# Patient Record
Sex: Female | Born: 1960 | Race: Black or African American | Hispanic: No | Marital: Single | Smoking: Current every day smoker
Health system: Southern US, Community
[De-identification: ages and names within clinical notes are randomized; demographics above are authoritative.]

## PROBLEM LIST (undated history)

## (undated) DIAGNOSIS — I10 Essential (primary) hypertension: Secondary | ICD-10-CM

## (undated) DIAGNOSIS — E785 Hyperlipidemia, unspecified: Secondary | ICD-10-CM

## (undated) DIAGNOSIS — L723 Sebaceous cyst: Secondary | ICD-10-CM

## (undated) HISTORY — PX: ABDOMINAL HYSTERECTOMY: SHX81

## (undated) HISTORY — DX: Sebaceous cyst: L72.3

## (undated) HISTORY — PX: APPENDECTOMY: SHX54

## (undated) HISTORY — DX: Essential (primary) hypertension: I10

## (undated) HISTORY — PX: OTHER SURGICAL HISTORY: SHX169

## (undated) HISTORY — DX: Hyperlipidemia, unspecified: E78.5

---

## 1999-07-12 ENCOUNTER — Other Ambulatory Visit: Admission: RE | Admit: 1999-07-12 | Discharge: 1999-07-12 | Payer: Self-pay | Admitting: Obstetrics and Gynecology

## 2000-12-25 ENCOUNTER — Other Ambulatory Visit: Admission: RE | Admit: 2000-12-25 | Discharge: 2000-12-25 | Payer: Self-pay | Admitting: *Deleted

## 2001-03-12 ENCOUNTER — Encounter (INDEPENDENT_AMBULATORY_CARE_PROVIDER_SITE_OTHER): Payer: Self-pay

## 2001-03-12 ENCOUNTER — Other Ambulatory Visit: Admission: RE | Admit: 2001-03-12 | Discharge: 2001-03-12 | Payer: Self-pay | Admitting: *Deleted

## 2001-06-18 ENCOUNTER — Other Ambulatory Visit: Admission: RE | Admit: 2001-06-18 | Discharge: 2001-06-18 | Payer: Self-pay | Admitting: Urology

## 2002-02-17 ENCOUNTER — Other Ambulatory Visit: Admission: RE | Admit: 2002-02-17 | Discharge: 2002-02-17 | Payer: Self-pay | Admitting: *Deleted

## 2003-09-26 ENCOUNTER — Other Ambulatory Visit: Admission: RE | Admit: 2003-09-26 | Discharge: 2003-09-26 | Payer: Self-pay | Admitting: Obstetrics and Gynecology

## 2003-10-18 ENCOUNTER — Ambulatory Visit (HOSPITAL_BASED_OUTPATIENT_CLINIC_OR_DEPARTMENT_OTHER): Admission: RE | Admit: 2003-10-18 | Discharge: 2003-10-18 | Payer: Self-pay | Admitting: Obstetrics and Gynecology

## 2003-10-18 ENCOUNTER — Encounter (INDEPENDENT_AMBULATORY_CARE_PROVIDER_SITE_OTHER): Payer: Self-pay | Admitting: Specialist

## 2003-10-18 ENCOUNTER — Ambulatory Visit (HOSPITAL_COMMUNITY): Admission: RE | Admit: 2003-10-18 | Discharge: 2003-10-18 | Payer: Self-pay | Admitting: Obstetrics and Gynecology

## 2004-03-02 ENCOUNTER — Encounter: Admission: RE | Admit: 2004-03-02 | Discharge: 2004-03-02 | Payer: Self-pay | Admitting: Obstetrics and Gynecology

## 2004-03-06 ENCOUNTER — Ambulatory Visit (HOSPITAL_COMMUNITY): Admission: RE | Admit: 2004-03-06 | Discharge: 2004-03-06 | Payer: Self-pay | Admitting: Obstetrics and Gynecology

## 2004-10-09 ENCOUNTER — Encounter (INDEPENDENT_AMBULATORY_CARE_PROVIDER_SITE_OTHER): Payer: Self-pay | Admitting: Specialist

## 2004-10-09 ENCOUNTER — Inpatient Hospital Stay (HOSPITAL_COMMUNITY): Admission: RE | Admit: 2004-10-09 | Discharge: 2004-10-11 | Payer: Self-pay | Admitting: Obstetrics and Gynecology

## 2005-02-19 ENCOUNTER — Encounter: Admission: RE | Admit: 2005-02-19 | Discharge: 2005-05-20 | Payer: Self-pay | Admitting: Family Medicine

## 2006-06-11 ENCOUNTER — Other Ambulatory Visit: Admission: RE | Admit: 2006-06-11 | Discharge: 2006-06-11 | Payer: Self-pay | Admitting: Obstetrics and Gynecology

## 2009-07-04 ENCOUNTER — Inpatient Hospital Stay (HOSPITAL_COMMUNITY): Admission: EM | Admit: 2009-07-04 | Discharge: 2009-07-05 | Payer: Self-pay | Admitting: Emergency Medicine

## 2010-03-26 ENCOUNTER — Ambulatory Visit: Payer: Self-pay | Admitting: Family Medicine

## 2010-03-26 DIAGNOSIS — L723 Sebaceous cyst: Secondary | ICD-10-CM

## 2010-03-26 DIAGNOSIS — E785 Hyperlipidemia, unspecified: Secondary | ICD-10-CM

## 2010-03-26 DIAGNOSIS — I10 Essential (primary) hypertension: Secondary | ICD-10-CM

## 2010-03-26 HISTORY — DX: Essential (primary) hypertension: I10

## 2010-03-26 HISTORY — DX: Sebaceous cyst: L72.3

## 2010-03-26 HISTORY — DX: Hyperlipidemia, unspecified: E78.5

## 2010-03-27 ENCOUNTER — Ambulatory Visit: Payer: Self-pay | Admitting: Family Medicine

## 2010-08-10 IMAGING — CT CT ANGIO CHEST
4 of 7 series · 15 of 30 positions shown · IV contrast (agent unspecified)
Comparison: Chest x-ray 07/04/2009

CLINICAL DATA: Central chest pain with shortness of breath.

CT ANGIOGRAPHY CHEST WITH CONTRAST
TECHNIQUE: Multidetector CT imaging of the chest was performed
using the standard protocol during bolus administration of
intravenous contrast. Multiplanar CT image reconstructions
including MIPs were obtained to evaluate the vascular anatomy.
Contrast: 100 ml

[Series 2: pe · axial · 0.70mm/px · z∈[-221,-32]mm · 8 of 203 slices shown]
[im 26/203  lung]
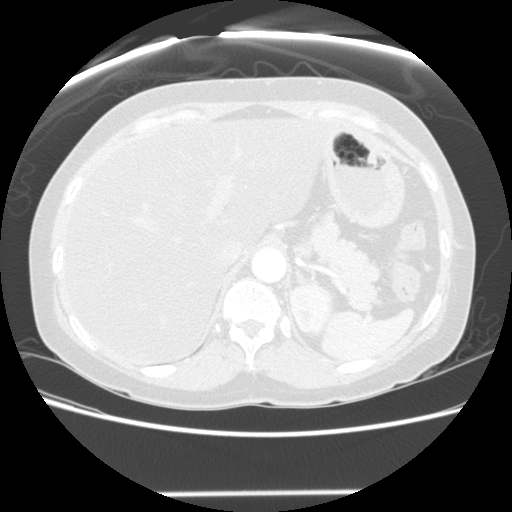
[im 51/203  mediastinal]
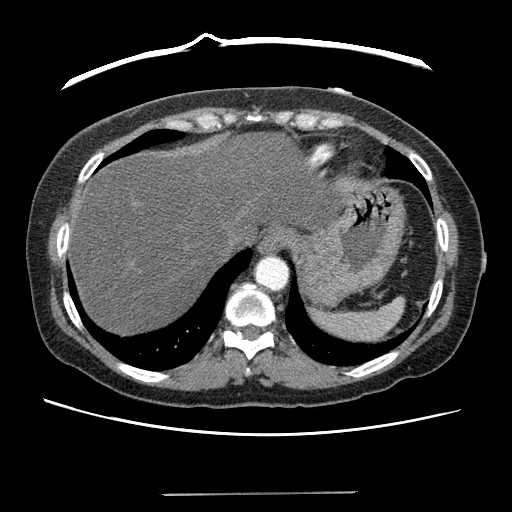
[im 76/203  lung]
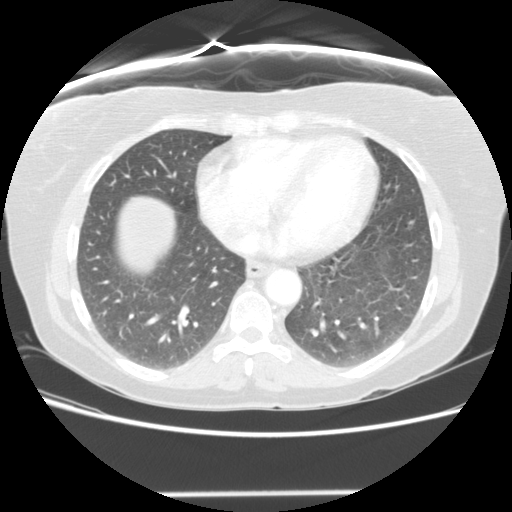
[im 102/203  mediastinal]
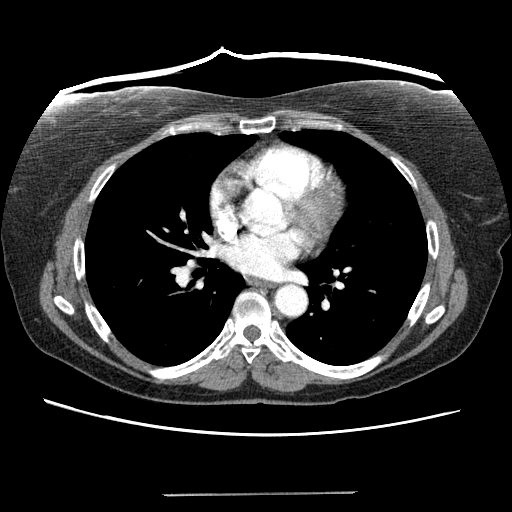
[im 116/203  lung]
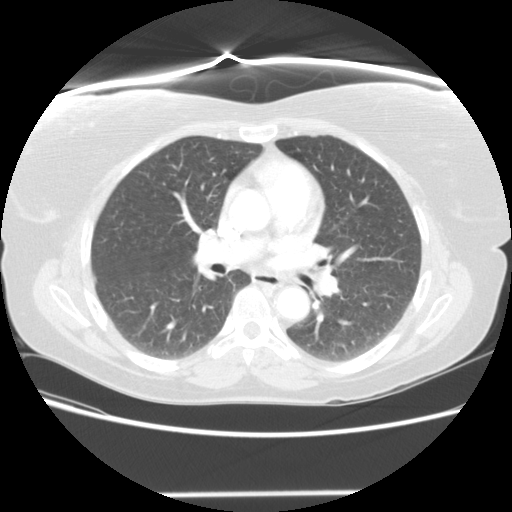
[im 127/203  mediastinal]
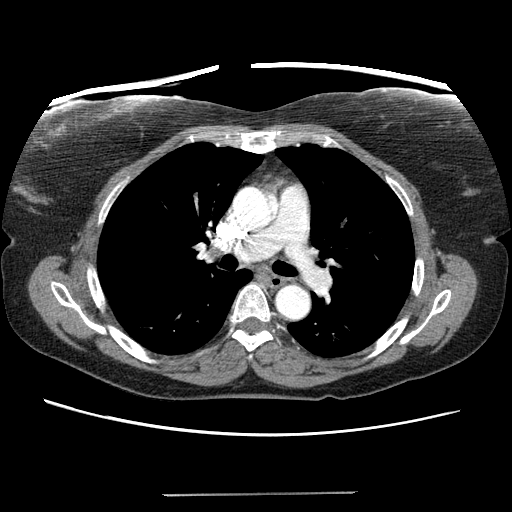
[im 152/203  lung]
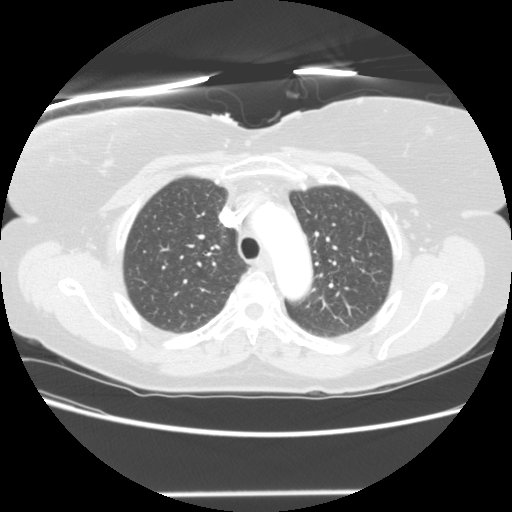
[im 177/203  mediastinal]
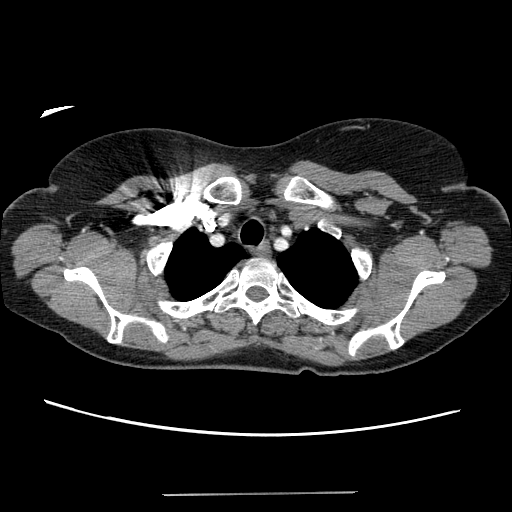

[Series 3: recon 2: pe · axial · 0.70mm/px · z∈[-170,-85]mm · 3 of 102 slices shown]
[im 34/102  lung]
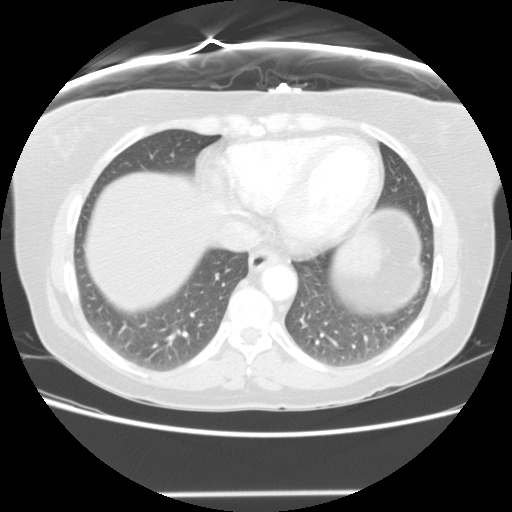
[im 59/102  lung]
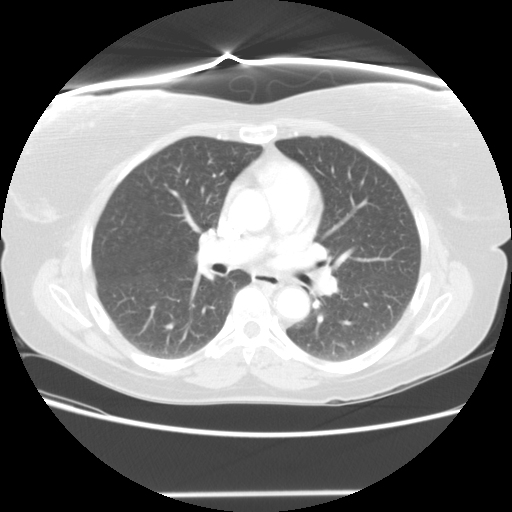
[im 68/102  lung]
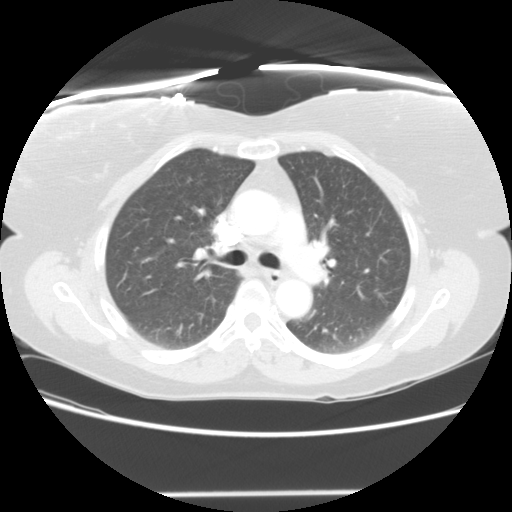

[Series 201: reformatted · sagittal · 0.70mm/px · 2 of 95 slices shown (1 of 2)]
[im 32/95  lung]
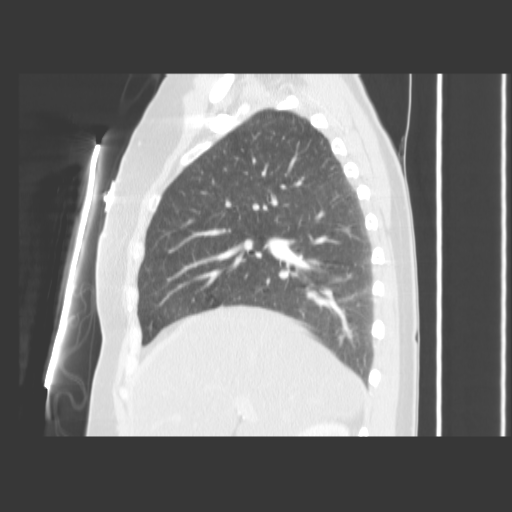
[im 63/95  lung]
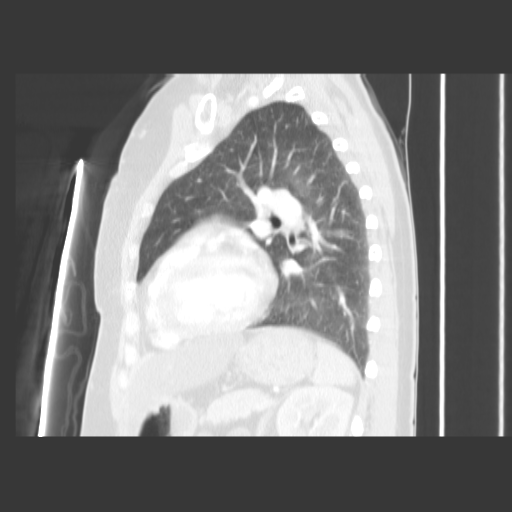

[Series 202: reformatted · sagittal · 0.70mm/px · 2 of 95 slices shown (2 of 2)]
[im 32/95  lung]
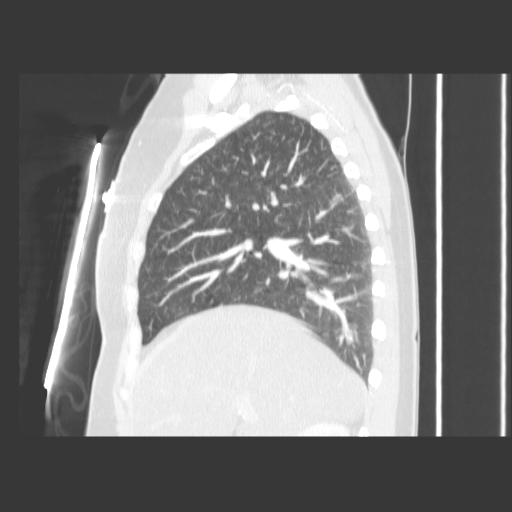
[im 63/95  lung]
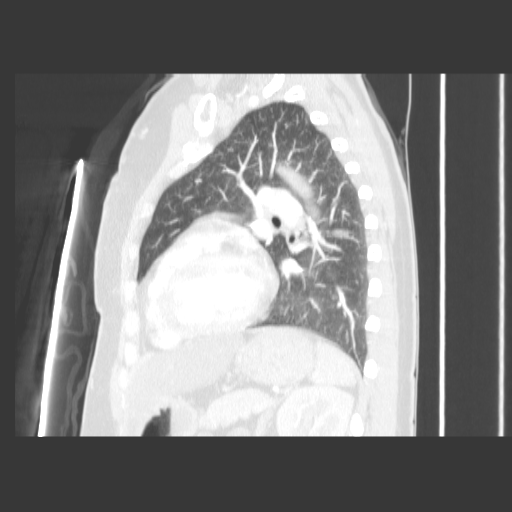

[15 of 30 positions shown; findings below may reference images not displayed]

FINDINGS: This is a technically adequate examination for
evaluating the pulmonary arterial tree.  No filling defects are
identified in the pulmonary arteries.  Coronary artery
calcifications are noted.  Heart size upper normal.
Atherosclerotic calcification is scattered in the thoracic aorta.
Thoracic aorta is normal in caliber.  Negative for aortic
dissection.

Negative for lymphadenopathy, pleural effusion, or pericardial
effusion.

Slight degradation of lung windows by respiratory motion.  The
trachea and mainstem bronchi are patent. The lungs are clear.  No
mass, airspace disease, pneumothorax, or significant atelectasis.
No acute osseous abnormality is identified.  Images of the upper
abdomen demonstrate diffuse fatty infiltration of the liver,
severe.

Review of the MIP images confirms the above findings.
IMPRESSION: 1.  Negative for pulmonary embolism.
2.  Coronary artery and thoracic aorta atherosclerotic disease.
3.  No acute findings in the lungs.
4.  Severe fatty infiltration of the liver.

## 2011-01-29 NOTE — Assessment & Plan Note (Signed)
Summary: BOIL UNDER RIGHT/TJ   Vital Signs:  Patient Profile:   50 Years Old Female CC:      boil under right arm pit X 3 days with increasing pain and tenderness Height:     61.5 inches Weight:      136 pounds O2 Sat:      99 % O2 treatment:    Room Air Temp:     98.6 degrees F oral Pulse rate:   106 / minute Resp:     22 per minute BP sitting:   170 / 96  (left arm) Cuff size:   regular  Pt. in pain?   yes    Location:   right arm pit    Intensity:   10    Type:       burning  Vitals Entered By: Lajean Saver, RN                   Updated Prior Medication List: * BOIL EAZE CREAM   Current Allergies: No known allergies History of Present Illness Chief Complaint: boil under right arm pit X 3 days with increasing pain and tenderness History of Present Illness: Subjective:  Patient complains of tender boil right axilla for about 3 days.  No fevers, chills, and sweats.  Feels well otherwise.  REVIEW OF SYSTEMS Constitutional Symptoms      Denies fever, chills, night sweats, weight loss, weight gain, and fatigue.  Eyes       Denies change in vision, eye pain, eye discharge, glasses, contact lenses, and eye surgery. Ear/Nose/Throat/Mouth       Denies hearing loss/aids, change in hearing, ear pain, ear discharge, dizziness, frequent runny nose, frequent nose bleeds, sinus problems, sore throat, hoarseness, and tooth pain or bleeding.  Respiratory       Denies dry cough, productive cough, wheezing, shortness of breath, asthma, bronchitis, and emphysema/COPD.  Cardiovascular       Denies murmurs, chest pain, and tires easily with exhertion.    Gastrointestinal       Denies stomach pain, nausea/vomiting, diarrhea, constipation, blood in bowel movements, and indigestion. Genitourniary       Denies painful urination, kidney stones, and loss of urinary control. Neurological       Denies paralysis, seizures, and fainting/blackouts. Musculoskeletal       Denies muscle  pain, joint pain, joint stiffness, decreased range of motion, redness, swelling, muscle weakness, and gout.  Skin       Denies bruising, unusual mles/lumps or sores, and hair/skin or nail changes.      Comments: boil under right arm pit Psych       Denies mood changes, temper/anger issues, anxiety/stress, speech problems, depression, and sleep problems.  Past History:  Past Medical History: Hyperlipidemia Hypertension  Past Surgical History: Hysterectomy Knee Sx Laced boil Ovarian cysts removed   Family History: none  Social History: Current Smoker 1PPD Alcohol use-yes 3-4/week Drug use-no Smoking Status:  current Drug Use:  no   Objective:  Patient appears quite uncomfortable with her right arm abducted at the shoulder. In the right axilla there is a 4cm dia tender erythematous fluctuant cyst with surrounding erythema. Assessment New Problems: SEBACEOUS CYST, INFECTED (ICD-706.2) HYPERTENSION (ICD-401.9) HYPERLIPIDEMIA (ICD-272.4)   Plan New Medications/Changes: LORTAB 5 5-500 MG TABS (HYDROCODONE-ACETAMINOPHEN) One or two tabs by mouth q4 to 6hr as needed pain  #8 (eight) x 0, 03/26/2010, Donna Christen MD MINOCYCLINE HCL 100 MG CAPS (MINOCYCLINE HCL) 1 by mouth two times a  day  #14 x 0, 03/26/2010, Donna Christen MD  New Orders: T-Culture, Wound [[id number]] Ketorolac-Toradol 15mg  [Q6578] New Patient Level III [46962] I&D Abscess, Simple / Single [10060] Planning Comments:   Culture taken Begin minocycline 100mg  two times a day.  Lortab for pain Return tomorrow for packing removal.   The patient and/or caregiver has been counseled thoroughly with regard to medications prescribed including dosage, schedule, interactions, rationale for use, and possible side effects and they verbalize understanding.  Diagnoses and expected course of recovery discussed and will return if not improved as expected or if the condition worsens. Patient and/or caregiver  verbalized understanding.   PROCEDURE:   I & D Site: Right axilla Size: 4cm Anesthesia: Local 1% likocaine with epi Procedure: Procedure:  Incise and Drain Abscess/Cyst Risks and benefits of procedure explained to patient and verbal consent granted.  Using sterile technique and local anesthesia with 1% lidocaine with epinephrine, cleansed affected area with Betadine and saline. Identified the most fluctuant area of lesion and incised with a #11 blade.  Expressed approximately 2cc sebacous and purulent material.  Inserted Iodoform gauze packing.  Bandage applied.  Patient tolerated well.  Disposition: Home Prescriptions: LORTAB 5 5-500 MG TABS (HYDROCODONE-ACETAMINOPHEN) One or two tabs by mouth q4 to 6hr as needed pain  #8 (eight) x 0   Entered and Authorized by:   Donna Christen MD   Signed by:   Donna Christen MD on 03/26/2010   Method used:   Print then Give to Patient   RxID:   [id number] MINOCYCLINE HCL 100 MG CAPS (MINOCYCLINE HCL) 1 by mouth two times a day  #14 x 0   Entered and Authorized by:   Donna Christen MD   Signed by:   Donna Christen MD on 03/26/2010   Method used:   Print then Give to Patient   RxID:   [id number]   Appended Document: BOIL UNDER RIGHT/TJ Toradol 30mg  IM R GLUT LOT# [id number] EXP: 08/01/2012Manuf:Hospira Pt tolerated injection well.

## 2011-01-29 NOTE — Assessment & Plan Note (Signed)
Summary: F/U Appt     followup: boil/cyst to right arm pit BP: 156/95 HR: 97 Temp: 99.4 RR: 20 O2 sat: 98%  Subjective:  Patient reports much less pain  Objective:  No acute distress  Right axilla:  wick removed from I and D site:  small amount of purulent drainage expressed.  Bandage applied.  No erythema.  Mild tenderness  Assessment:  ENCOUNTER CHANGE/REMOVAL SURGICAL WOUND DRESSING (ICD-V58.31)   Plan:   Finish antibiotic.  Warm soaks.  Return for increased pain, swelling, fever, etc.  Culture pending.

## 2011-04-07 LAB — URINE MICROSCOPIC-ADD ON

## 2011-04-07 LAB — COMPREHENSIVE METABOLIC PANEL
ALT: 214 U/L — ABNORMAL HIGH (ref 0–35)
AST: 274 U/L — ABNORMAL HIGH (ref 0–37)
Albumin: 4.3 g/dL (ref 3.5–5.2)
Alkaline Phosphatase: 52 U/L (ref 39–117)
BUN: 8 mg/dL (ref 6–23)
CO2: 26 mEq/L (ref 19–32)
Calcium: 10.2 mg/dL (ref 8.4–10.5)
Chloride: 93 mEq/L — ABNORMAL LOW (ref 96–112)
Creatinine, Ser: 0.67 mg/dL (ref 0.4–1.2)
GFR calc Af Amer: >60 mL/min (ref >60)
GFR calc non Af Amer: >60 mL/min (ref >60)
Glucose, Bld: 78 mg/dL (ref 70–99)
Potassium: 3.9 mEq/L (ref 3.5–5.1)
Sodium: 132 mEq/L — ABNORMAL LOW (ref 135–145)
Total Bilirubin: 1.2 mg/dL (ref 0.3–1.2)
Total Protein: 7.7 g/dL (ref 6.0–8.3)

## 2011-04-07 LAB — BASIC METABOLIC PANEL
BUN: 9 mg/dL (ref 6–23)
CO2: 23 mEq/L (ref 19–32)
Calcium: 10.1 mg/dL (ref 8.4–10.5)
Chloride: 95 mEq/L — ABNORMAL LOW (ref 96–112)
Creatinine, Ser: 0.64 mg/dL (ref 0.4–1.2)
GFR calc Af Amer: >60 mL/min (ref >60)
GFR calc non Af Amer: >60 mL/min (ref >60)
Glucose, Bld: 168 mg/dL — ABNORMAL HIGH (ref 70–99)
Potassium: 3.7 mEq/L (ref 3.5–5.1)
Sodium: 134 mEq/L — ABNORMAL LOW (ref 135–145)

## 2011-04-07 LAB — CK TOTAL AND CKMB (NOT AT ARMC)
CK, MB: 1.4 ng/mL (ref 0.3–4.0)
Relative Index: 1 (ref 0.0–2.5)
Total CK: 136 U/L (ref 7–177)

## 2011-04-07 LAB — CBC
HCT: 36 % (ref 36.0–46.0)
Hemoglobin: 12.6 g/dL (ref 12.0–15.0)
MCHC: 35.1 g/dL (ref 30.0–36.0)
MCV: 97.1 fL (ref 78.0–100.0)
Platelets: 123 10*3/uL — ABNORMAL LOW (ref 150–400)
RBC: 3.71 MIL/uL — ABNORMAL LOW (ref 3.87–5.11)
RDW: 13.4 % (ref 11.5–15.5)
WBC: 8.5 10*3/uL (ref 4.0–10.5)

## 2011-04-07 LAB — LIPID PANEL
Cholesterol: 393 mg/dL — ABNORMAL HIGH (ref 0–200)
HDL: 160 mg/dL (ref >39)
LDL Cholesterol: 219 mg/dL — ABNORMAL HIGH (ref 0–99)
Total CHOL/HDL Ratio: 2.5 RATIO
Triglycerides: 71 mg/dL (ref <150)
VLDL: 14 mg/dL (ref 0–40)

## 2011-04-07 LAB — DIFFERENTIAL
Basophils Absolute: 0 10*3/uL (ref 0.0–0.1)
Basophils Relative: 0 % (ref 0–1)
Eosinophils Absolute: 0 10*3/uL (ref 0.0–0.7)
Eosinophils Relative: 0 % (ref 0–5)
Lymphocytes Relative: 8 % — ABNORMAL LOW (ref 12–46)
Lymphs Abs: 0.7 10*3/uL (ref 0.7–4.0)
Monocytes Absolute: 0.5 10*3/uL (ref 0.1–1.0)
Monocytes Relative: 6 % (ref 3–12)
Neutro Abs: 7.4 10*3/uL (ref 1.7–7.7)
Neutrophils Relative %: 86 % — ABNORMAL HIGH (ref 43–77)

## 2011-04-07 LAB — POCT CARDIAC MARKERS
CKMB, poc: <1 ng/mL — ABNORMAL LOW (ref 1.0–8.0)
CKMB, poc: <1 ng/mL — ABNORMAL LOW (ref 1.0–8.0)
Myoglobin, poc: 59.2 ng/mL (ref 12–200)
Myoglobin, poc: 65.7 ng/mL (ref 12–200)
Troponin i, poc: <0.05 ng/mL (ref 0.00–0.09)
Troponin i, poc: <0.05 ng/mL (ref 0.00–0.09)

## 2011-04-07 LAB — URINALYSIS, ROUTINE W REFLEX MICROSCOPIC
Glucose, UA: NEGATIVE mg/dL
Hgb urine dipstick: NEGATIVE
Ketones, ur: >80 mg/dL — AB
Leukocytes, UA: NEGATIVE
Nitrite: POSITIVE — AB
Protein, ur: 100 mg/dL — AB
Specific Gravity, Urine: 1.029 (ref 1.005–1.030)
Urobilinogen, UA: 1 mg/dL (ref 0.0–1.0)
pH: 7 (ref 5.0–8.0)

## 2011-04-07 LAB — RAPID URINE DRUG SCREEN, HOSP PERFORMED
Amphetamines: NOT DETECTED
Barbiturates: NOT DETECTED
Benzodiazepines: NOT DETECTED
Cocaine: NOT DETECTED
Opiates: NOT DETECTED
Tetrahydrocannabinol: NOT DETECTED

## 2011-04-07 LAB — CARDIAC PANEL(CRET KIN+CKTOT+MB+TROPI)
CK, MB: 1.6 ng/mL (ref 0.3–4.0)
CK, MB: 1.9 ng/mL (ref 0.3–4.0)
Relative Index: 1.1 (ref 0.0–2.5)
Relative Index: 1.3 (ref 0.0–2.5)
Total CK: 147 U/L (ref 7–177)
Total CK: 152 U/L (ref 7–177)
Troponin I: 0.01 ng/mL (ref 0.00–0.06)
Troponin I: 0.03 ng/mL (ref 0.00–0.06)

## 2011-04-07 LAB — CULTURE, BLOOD (ROUTINE X 2)
Culture: NO GROWTH
Culture: NO GROWTH

## 2011-04-07 LAB — MAGNESIUM: Magnesium: 1.6 mg/dL (ref 1.5–2.5)

## 2011-04-07 LAB — BRAIN NATRIURETIC PEPTIDE: Pro B Natriuretic peptide (BNP): <30 pg/mL (ref 0.0–100.0)

## 2011-04-07 LAB — PHOSPHORUS: Phosphorus: 3.5 mg/dL (ref 2.3–4.6)

## 2011-04-07 LAB — TSH: TSH: 3.229 u[IU]/mL (ref 0.350–4.500)

## 2011-04-07 LAB — ETHANOL: Alcohol, Ethyl (B): <5 mg/dL (ref 0–10)

## 2011-04-07 LAB — HOMOCYSTEINE: Homocysteine: 24.8 umol/L — ABNORMAL HIGH (ref 4.0–15.4)

## 2011-04-07 LAB — D-DIMER, QUANTITATIVE: D-Dimer, Quant: 0.94 ug/mL-FEU — ABNORMAL HIGH (ref 0.00–0.48)

## 2011-04-07 LAB — POCT PREGNANCY, URINE: Preg Test, Ur: NEGATIVE

## 2011-04-07 LAB — TROPONIN I: Troponin I: 0.02 ng/mL (ref 0.00–0.06)

## 2011-05-14 NOTE — Discharge Summary (Signed)
Ruth Hayden, Ruth Hayden             ACCOUNT NO.:  [id number]   MEDICAL RECORD NO.:  [id number]          PATIENT TYPE:  INP   LOCATION:  2030                         FACILITY:  [facility]   PHYSICIAN:  Eduard Clos, MDDATE OF BIRTH:  16-Apr-1961   DATE OF ADMISSION:  07/04/2009  DATE OF DISCHARGE:  07/05/2009                               DISCHARGE SUMMARY   COURSE IN THE HOSPITAL:  A 50 year old female with a history of chronic  alcohol use and hypertension, presently complaining of chest pain.  The  patient was admitted to telemetry floor.  Serial cardiac enzymes were  within acceptable limits.  The patient had EKG, she did not have any  acute finding.  On admission, the patient had CT angio chest, which was  negative for pulmonary embolism, no acute findings in the lungs, severe  fatty infiltration of the liver, coronary artery and thoracic aorta  atherosclerotic disease.  Chest x-ray was showing nothing acute.  The  patient did have elevated LFTs.  The patient drinks alcohol daily.  She  states she drinks liquor.  AST and ALTs were high.  Alkaline phosphatase  was only 52.  There is no abdominal tenderness to suggest any  cholecystitis.  At this time, the patient's chest pain has resolved.  We  will discharge home and we will call [facility] for  followup appointment, which they have made note of the patient's  information, and will be calling her.  At this time, the patient will be  discharged home on 2 more days of antibiotics for UTI along with  metoprolol for hypertension and omeprazole for gastritis.  At the time  of this dictation, the patient hemodynamically is stable.   PROCEDURE DONE DURING THE STAY:  1. Chest x-ray on July 04, 2009, nothing acute.  2. CT angio chest on July 04, 2009 showed negative for pulmonary      embolism, coronary artery and thoracic aorta atherosclerotic      disease.  No acute findings in the lungs.  Severe fatty  infiltration of the liver.   PERTINENT LABORATORIES:  The patient's hemoglobin was 12.6.  Creatinine  was 0.6.  Troponins all were negative.  LFTs showing AST and ALT of 274  and 214 with active alcohol drinking, would contribute this is a cause,  but anyway the patient was advised that need to follow up as an  outpatient through her primary care physician.   FINAL DIAGNOSES:  1. Chest pain, rule out acute coronary syndrome.  2. Hypertension.  3. Cigarette smoking.  4. Chronic alcohol.  5. Abnormal LFTs, probably secondary to alcoholism.   MEDICATIONS ON DISCHARGE:  1. Omeprazole 40 mg p.o. daily.  2. Doxycycline 100 mg p.o. b.i.d. for 2 more days and stop for UTI.  3. Metoprolol 12.5 mg p.o. b.i.d.  4. Nicotine patch.   PLAN:  To follow up with [facility] as scheduled.  They will be calling to make an appointment.  The patient has been  provided information about [facility] to make appointment to  [facility]  and follow up with them.  The patient was strongly advised to quit  smoking and drinking alcohol.  To be on a cardiac healthy diet.  Recheck  LFTs and BMET in a week's time and we will need further workup on  abnormal LFTs and to recheck a fasting lipid panel in 2 months' time.      Eduard Clos, MD  Electronically Signed     ANK/MEDQ  D:  07/05/2009  T:  07/06/2009  Job:  [id number]

## 2011-05-14 NOTE — H&P (Signed)
NAMEMIYOKO, HASHIMI             ACCOUNT NO.:  [id number]   MEDICAL RECORD NO.:  [id number]          PATIENT TYPE:  EMS   LOCATION:  MAJO                         FACILITY:  [facility]   PHYSICIAN:  Carlena Hurl, MDDATE OF BIRTH:  Sep 17, 1961   DATE OF ADMISSION:  07/04/2009  DATE OF DISCHARGE:                              HISTORY & PHYSICAL   CHIEF COMPLAINT:  One-day episode of midsternal chest pain.   HISTORY OF PRESENT ILLNESS:  This is a 50 year old African American  female who has a past medical  history significant for anxiety and  hypertension, came in today with 1-day episode of midsternal chest pain.  The whole history was obtained  by the patient.  So according to this  patient, this patient  was at work this morning and around 10:00-11:00  a.m., the patient initially noticed nausea.  She felt nauseous and later  she started having a chest pain in the midsternal and also on the finger  tip and the patient also started sweating at that time.  She said that  her chest pain was increasing with deep breath and she sat for sometime,  but as she continued to have this chest pain, the office people called  EMS and the patient was brought here.  While en route, the patient was  given sublingual nitroglycerin and aspirin, which relieved her chest  pain.  The patient denied having any headache, dizziness, abdominal  pain, diarrhea, constipation.  This patient denied having any similar  episodes in the past.  She says that she never had any heart problems,  but she was checked for stress test about 3 years ago.  She does not  know the reason why it was checked and she does not remember the results  of the stress test.  She has a history of anxiety, but her last anxiety  attack was many years ago and she was not even taking any anxiety  medications.  The patient describes the chest pain as a sharp pain as if  somebody is pressing on her chest and initially started with 8/10  and  after the patient was brought to the ER and after she was given  nitroglycerin and aspirin, the patient's chest pain has come down to 1-  2, and the patient's chest pain increased with deep inspiration and  relieved with nitroglycerin and aspirin.   PAST MEDICAL HISTORY:  Significant for anxiety, history of hypertension,  history of several recurrent ovarian cyst.   PAST SURGICAL HISTORY:  The patient has total abdominal hysterectomy,  left salpingo-oophorectomy in 1992, appendectomy in 1964, had cyst  removed in 1987 and 1989, and also had a knee surgery.   ALLERGIES:  No known drug allergies.   MEDICATIONS:  None.  She was taking medications about 2 years ago for  hypertension, but because this patient lost job and insurance, she could  not afford any medications.   FAMILY HISTORY:  The patient says that hypertension runs in her family,  but no heart problems runs in her family.   SOCIAL HISTORY:  The patient says that she smokes about half  a pack of  cigarettes for the past 20-25 years and also drinks about 3 shots of hot  liquor on and off for the past 10-15 years.  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  This patient lost her  job and lost her insurance, so does not see any physicians and does not  take any medications.   REVIEW OF SYSTEMS:  Pretty much the same as history of present illness.   PHYSICAL EXAMINATION:  GENERAL:  This is a 50 year old African American  female who is lying comfortably on the bed without any severe chest pain  or shortness of breath.  VITAL SIGNS:  When she came into the ER blood pressure 149/85, pulse  rate of 80, temperature 99.3, respirations 21, and saturating 100% on  room air.  HEENT:  Head is atraumatic and normocephalic.  Pupils PERRLA.  Tympanic  membranes intact.  No discharge from eyes or ears.  NECK:  Supple.  No JVD.  No  adenopathy appreciated.  LUNGS:  Clear to auscultation bilaterally.  CVS:  S1 and S2 heard with regular rate and rhythm.  ABDOMEN:  Soft.  Bowel sounds present.  Nontender.  Nondistended.  EXTREMITIES:  No pedal edema noted.  Pulses palpable bilaterally.  CNS:  Awake, alert, and oriented x3.  No focal neurological deficit  noted.   LABORATORY DATA:  This patient revealed WBC 8.5, hemoglobin 12.6,  hematocrit 36.0, platelets of 123, neutrophils of 86%.  D-dimers of  0.94.  Sodium 134, potassium 3.7, chloride 95, bicarb 23, and glucose  168.  BUN 9, creatinine 0.64.  First set of troponin I less than 0.05.  First set of CK-MB less than 1.  Myoglobin 65.7.  Urine pregnancy test  is negative.  Urine drug screen is pretty much normal.  Alcohol level is  normal less than 5.  Urinalysis revealed specific gravity 1.029, nitrite  being positive with many bacteria, but wbc only 0-2.  The patient had a  CT chest done, which showed negative for pulmonary embolism with  coronary artery and thoracic artery atherosclerotic disease.  Otherwise,  no acute changes shown in the lungs and severe fatty infiltration of the  liver.   ASSESSMENT AND PLAN:  A 50 year old [place] American female with the  history of anxiety and hypertension, coming in with 1-day episode of  midsternal chest pain a few hours associated with nausea and  diaphoresis.  1. Chest pain, rule myocardial infarction.  As this patient is a 65-      year-old and is complaining of midsternal chest pain, which      relieved with sublingual nitroglycerin and aspirin.  I will admit      this patient to rule out myocardial infarction.  So far, the      patient's first set of cardiac enzymes are normal and EKG is pretty      normal, and the patient's chest pain has been around 1-2 now after      sublingual nitroglycerin and aspirin.  She will continue to monitor      the EKG and cardiac enzymes and once the patient's chest pain is      stable  and all the results come back as normal then she will need      an stress test done as an outpatient.  2. History of anxiety.  The  patient's last attack was many years ago      and not on any medication.  She received Ativan while she came into      the ER initially.  The patient denies having any anxiety symptoms      at this time.  So, we will continue to monitor and give her Ativan      as needed.  3. Urinary tract infection.  The patient has nitrite positive with      many bacteria, so we will go ahead and get urine culture and      sensitivity, and at this time, we will start this patient on      Rocephin IV 1 g 1 time a day.  4. History of hypertension.  The patient's blood pressure is around      149/85 and she has history of hypertension, but not on any      medications because as she could not afford any medications because      of no insurance at this time.  We will start this patient on      hydrochlorothiazide 12.5 mg p.o. 1 time a day.  5. For deep venous thrombosis prophylaxis, the patient will be started      on Lovenox 40 mg subcu 1 time a day.      Carlena Hurl, MD  Electronically Signed     JD/MEDQ  D:  07/04/2009  T:  07/05/2009  Job:  [id number]

## 2011-05-17 NOTE — Op Note (Signed)
Ruth Hayden, CZAPLICKI             ACCOUNT NO.:  [id number]   MEDICAL RECORD NO.:  [id number]          PATIENT TYPE:  INP   LOCATION:  9399                          FACILITY:  [facility]   PHYSICIAN:  Daniel L. Gottsegen, M.D.DATE OF BIRTH:  1961/07/25   DATE OF PROCEDURE:  10/09/2004  DATE OF DISCHARGE:                                 OPERATIVE REPORT   PREOPERATIVE DIAGNOSES:  1.  Recurrent right ovarian cyst.  2.  Pelvic pain.  3.  Pelvic adhesive disease.   POSTOPERATIVE DIAGNOSES:  1.  Recurrent right ovarian cyst.  2.  Pelvic pain.  3.  Pelvic adhesive disease.   OPERATION:  Total abdominal hysterectomy, right salpingo-oophorectomy.   SURGEON:  Daniel L. Eda Paschal, M.D.   FIRST ASSISTANT:  Juan H. Lily Peer, M.D.   ANESTHESIA:  General endotracheal.   FINDINGS:  At the time of the surgery, the patient's right adnexa was  slightly enlarged by small cysts. It was densely adherent to the broad  ligament and was densely adherent to the uterus. The ureter was adherent in  this area and was kinked as a result of the above adhesive disease. The  uterus itself was normal size and shape, the left adnexa was surgically  absent and that side appeared free of disease. The uterus was adherent to  the cul-de-sac by adhesions.   DESCRIPTION OF PROCEDURE:  After adequate general endotracheal anesthesia,  the patient was placed in the supine position, prepped and draped in the  usual sterile manner. A Foley catheter was inserted into her bladder, a  Pfannenstiel incision was made, the fascia was opened transversely, the  peritoneum was entered vertically. The subcutaneous bleeders were clamped  and bovied encountered. Because of her previous surgery, exposure seemed to  be somewhat limited so the rectus muscles were partially separated with the  Bovie. They were not completely separated and the hypogastric's were not  ligated. At this point, the peritoneal cavity was opened, findings were  as  above, the bowel was packed. The round ligaments were bovied and cut. The  vesicouterine fold of peritoneum was sharply incised.  On the left side, the  attachment of the uterus where the uteroovarian ligament used to be was  clamped, cut and doubly suture ligated with #1 chromic catgut. #1 chromic  catgut was utilized for all pedicles on the uterus. On the right side, the  retroperitoneal space was entered, the ureter could be identified, the IP  ligament was clamped, cut and doubly suture ligated with #1 chromic catgut  after isolating the ureter. The adnexa was still very densely adherent to  the broad ligament and it was felt that we should proceed with hysterectomy  first therefore the uterine arteries were bilaterally clamped, cut and  doubly suture ligated, the parametrium was taken down in successive bites by  clamping, cutting and suture ligating. The cervicovaginal junction was  identified, entered with sharp dissection, the uterus was sent to pathology  for tissue diagnosis. Angled sutures were placed in the angles of the vagina  with #1 chromic catgut, the cuff was closed with figure-of-eights of #0  Vicryl. There was no bleeding noted at this point. Attention was next turned  back to the adnexa, the adnexa now could be elevated and tented better. A  space between the peritoneum and the ovary was entered. This freed up the  ovary a little bit more and also got the ureter to get further away from the  dissection. The ureter was then traced and being careful to avoid it, the  adnexa now could be completely freed up from the ureter, the peritoneum and  was removed and sent to pathology for tissue diagnosis. Two sponge, needle  and instrument counts were correct.  The peritoneum was closed with a  running #0 Vicryl. Copious irrigation was done above the peritoneum and then  the fascia was closed with two running #0 Vicryl, the skin was closed with  staples. Estimated blood loss  for the entire procedure was 150 mL with none  replaced. The patient tolerated the procedure well and left the operating  room in satisfactory condition draining clear urine from her Foley catheter.     Dani   DLG/MEDQ  D:  10/09/2004  T:  10/09/2004  Job:  [id number]

## 2011-05-17 NOTE — Op Note (Signed)
Ruth Hayden, Ruth Hayden                         ACCOUNT NO.:  [id number]   MEDICAL RECORD NO.:  [id number]                   PATIENT TYPE:  AMB   LOCATION:  [facility]                                 FACILITY:  [facility]   PHYSICIAN:  Daniel L. Eda Paschal, M.D.           DATE OF BIRTH:  [birth year]   DATE OF PROCEDURE:  10/18/2003  DATE OF DISCHARGE:                                 OPERATIVE REPORT   PREOPERATIVE DIAGNOSES:  1. Chronic pelvic pain.  2. 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   POSTOPERATIVE DIAGNOSES:  1. Chronic pelvic pain.  2. Pelvic adhesive disease.  3. Possible endometriosis.  4. Right ovarian cyst.   OPERATION:  Open diagnostic laparoscopy with lysis of pelvic adhesions with  YAG laser and excision of right ovarian cyst.   SURGEON:  Daniel L. Eda Paschal, M.D.   ANESTHESIA:  General.   INDICATIONS:  The patient is a 50 year old female with a persistent  recurrent history of right lower quadrant pain.  She has been treated with  antibiotics.  She has been treated with oral progestins, and she has  continued to have the above.  Her past history is very significant that she  had a ruptured appendix when she was younger.  She then has had three  laparoscopies at another location for pelvic pain, and the last time she had  a left salpingo-oophorectomy and it was remarked that she still had pelvic  adhesive disease.  It is suspected that she has chronic pelvic adhesive  disease as well as probable chronic pelvic inflammatory disease.  She enters  the hospital now for diagnostic laparoscopy.  It will be done open because  of the above and if it is appropriate, she will undergo a right  salpingectomy.   FINDINGS:  At the time of surgery the patient's uterus was bound to  mesentery of the large bowel so that there was absolutely no cul-de-sac.  Left ovary and tube were surgically absent, and actually that area  looked  completely clean.  On the right side she actually had a normal fallopian  tube with normal fimbriae, so it did not appear that she had had pelvic  inflammatory disease.  There was a large 4 cm cystic mass between the right  fallopian tube and the uterus with the ovary posterior to it.   DESCRIPTION OF PROCEDURE:  After adequate general endotracheal anesthesia,  the patient was placed in the dorsal lithotomy position and prepped and  draped in the usual sterile manner.  A Hulka tenaculum was inserted in the  patient's uterus and a Foley catheter was inserted in the patient's bladder.  An open diagnostic laparoscopy was done because of all her previous surgery.  A subumbilical transverse incision was made.  It was carried down to the  fascia, which was also opened transversely.  The fascia was tagged with 0  Vicryl.  The peritoneum was identified and entered, and the patient did have  some adhesions near the periumbilical area, which would have made closed  laparoscopy somewhat dangerous.  You actually had to find a little window in  order to get down to the pelvis, but this was done and the above findings  were noted.  A 5 mm port was placed in the pelvic, pictures were taken for  documentation.  A neodymium:YAG laser with a GR-4 tip over the bare fiber  was placed and with 13 watts power, the cul-de-sac was completely freed up  so that the sigmoid colon fell away from it and she now had a free cul-de-  sac.  There was one area where the sigmoid was attached to the right  uterosacral ligament, which could not be freed up with fear of injuring the  bowel.  The cystic structure between the tube and the uterus was entered and  it drained blood and also appeared to be almost chocolate-like material.  It  was excised with the neodymium:YAG laser.  Part of the base had to be left.  Bleeding was controlled with bipolar, and Surgicel was left in the base and  at the termination of the  procedure, there was no bleeding noted.  All cul-  de-sac fluid was removed, pneumoperitoneum was evacuated.  The fascial  incision was closed with a 0 Vicryl and the skin was closed with Steri-  Strips.  Estimated blood loss for the entire procedure was 50 mL with none  replaced.  The patient tolerated her procedure well and left the operating  room in satisfactory condition.                                               Daniel L. Eda Paschal, M.D.    [name]  D:  10/18/2003  T:  10/18/2003  Job:  [id number]

## 2011-05-17 NOTE — H&P (Signed)
Ruth Hayden, Ruth Hayden             ACCOUNT NO.:  [id number]   MEDICAL RECORD NO.:  [id number]          PATIENT TYPE:  INP   LOCATION:  NA                            FACILITY:  [facility]   PHYSICIAN:  Daniel L. Gottsegen, M.D.DATE OF BIRTH:  [birth year]   DATE OF ADMISSION:  DATE OF DISCHARGE:                                HISTORY & PHYSICAL   CHIEF COMPLAINT:  Chronic pelvic pain, recurrent ovarian cysts, and pelvic  adhesive disease.   HISTORY OF PRESENT ILLNESS:  The patient is a 50 year old gravida 1 para 0  AB 1 who enters the hospital now for total abdominal hysterectomy and right  ovarian cystectomy.  When the patient was much younger she had a ruptured  appendix and ever since then she has had a lot of trouble with pelvic pain.  She had three procedures done elsewhere for pelvic pain with lysis of  adhesions and recurrent ovarian cysts.  These were all done  laparoscopically, and this finally culminated in a laparotomy with a left  salpingo-oophorectomy.  For a while she did well, and then in October 2004  she was seen by me and started to have trouble with right lower quadrant  pain and had a cyst on her right ovary.  She underwent laparoscopy with  excision of right ovarian cyst and lysis of some adhesions, but she had a  completely obliterated cul-de-sac because of adhesions.  The ovary on the  right was densely adherent to the broad ligament and it was felt that there  was a good chance she would have a reoccurrence.  For a while she has done  well.  However, in September 2005 she started to complain of pelvic pain  again.  She was ultrasounded and she had multiple cysts of about 3.5 cm.  They were complex cysts and they were associated with a recurrence of her  right lower quadrant pain as well as dyspareunia.  She has been rescanned,  the cysts have gotten slightly smaller, they are still complex, and she is  still complaining of chronic pelvic pain.  She has now had five  different  procedures for removal of ovarian cysts, as well as lysis of adhesions, and  she now enters the hospital for total abdominal hysterectomy, right salpingo-  oophorectomy, lysis of adhesions for the above.  She appreciates that after  this she will be menopausal and will probably require hormonal therapy.   PAST MEDICAL HISTORY:  1.  Appendectomy in 1964.  2.  Ovarian cysts in 1987, 1989.  3.  Left salpingo-oophorectomy in 1992.  4.  Several recurrent ovarian cysts requiring surgeries in the late 1990s      and early 2000.  5.  Diagnostic laparoscopy with lysis of adhesions and right ovarian      cystectomy by Dr. Eda Paschal in October 2004.   PRESENT MEDICATIONS:  1.  Celexa for depression 20 mg a day.  2.  Nexium 40 mg daily for reflux.   She is allergic to no drugs.   FAMILY HISTORY:  Contributory in that her aunt is diabetic and her mother is  hypertensive.   SOCIAL HISTORY:  She has two to three drinks a month and she does smoke one-  and-a-half packs of cigarettes a day.   REVIEW OF SYSTEMS:  HEENT:  Basically negative.  CARDIAC:  Negative.  RESPIRATORY:  Negative.  GI:  Reveals upper GI symptoms such as reflux,  nausea.  MUSCULOSKELETAL:  Negative.  GU:  Negative.  NEUROLOGIC AND  PSYCHIATRIC:  Reveals depression responding to Celexa.  ALLERGIC,  IMMUNOLOGIC, LYMPHATIC, AND ENDOCRINE:  Negative.   PHYSICAL EXAMINATION:  GENERAL:  The patient is a well-developed and well-  nourished female in no acute distress.  VITAL SIGNS:  Blood pressure is 134/80, pulse is 80 and regular,  respirations 16 and nonlabored; she is afebrile.  HEENT:  All within normal limits.  NECK:  Supple, trachea in the midline.  Thyroid is not enlarged.  LUNGS:  Clear to P&A.Marland Kitchen  HEART:  No thrills, heaves, or murmurs.  BREASTS:  No masses.  ABDOMEN:  Soft without guarding, rebound, or masses.  PELVIC:  External and vaginal is normal.  Cervix is clean.  Uterus is  retroverted, slightly  tender, and fixed.  Right adnexa slightly enlarged and  tender.  Left adnexa is surgically absent.  Rectovaginal is confirmatory.   ADMISSION IMPRESSION:  1.  Recurrent ovarian cysts.  2.  Recurrent pelvic pain.  3.  Chronic pelvic adhesive disease.   PLAN:  Total abdominal hysterectomy, right salpingo-oophorectomy.      DLG/MEDQ  D:  10/08/2004  T:  10/08/2004  Job:  [id number]

## 2016-10-03 DIAGNOSIS — Z23 Encounter for immunization: Secondary | ICD-10-CM | POA: Diagnosis not present

## 2016-12-02 DIAGNOSIS — E785 Hyperlipidemia, unspecified: Secondary | ICD-10-CM | POA: Diagnosis not present

## 2016-12-02 DIAGNOSIS — Z131 Encounter for screening for diabetes mellitus: Secondary | ICD-10-CM | POA: Diagnosis not present

## 2016-12-02 DIAGNOSIS — Z0000 Encounter for general adult medical examination without abnormal findings: Secondary | ICD-10-CM | POA: Diagnosis not present

## 2017-03-12 DIAGNOSIS — E785 Hyperlipidemia, unspecified: Secondary | ICD-10-CM | POA: Diagnosis not present

## 2017-10-07 DIAGNOSIS — Z23 Encounter for immunization: Secondary | ICD-10-CM | POA: Diagnosis not present

## 2017-12-11 DIAGNOSIS — E785 Hyperlipidemia, unspecified: Secondary | ICD-10-CM | POA: Diagnosis not present

## 2017-12-11 DIAGNOSIS — Z0000 Encounter for general adult medical examination without abnormal findings: Secondary | ICD-10-CM | POA: Diagnosis not present

## 2017-12-11 DIAGNOSIS — R0789 Other chest pain: Secondary | ICD-10-CM | POA: Diagnosis not present

## 2017-12-15 ENCOUNTER — Telehealth: Payer: Self-pay | Admitting: *Deleted

## 2017-12-15 NOTE — Telephone Encounter (Signed)
Referral from Farris HasAaron Morrow, MD of [facility] sent to scheduling

## 2018-01-14 NOTE — Progress Notes (Signed)
Referring- Farris HasAaron Morrow MD Reason for referral-Chest pain  HPI: 57 year old female for evaluation of chest pain at request of Farris HasAaron Morrow MD. Patient states that since December she has had occasional chest heaviness.  She notices this when she walks to a room at work and smells ink.  She also can have it with certain activities.  Typically last 2-3 minutes and resolves.  Not pleuritic, positional or related to food.  No associated nausea, dyspnea or diaphoresis.  She otherwise has mild dyspnea on exertion but no orthopnea, PND, pedal edema or syncope.  Because of the above cardiology asked to evaluate.  Current Outpatient Medications  Medication Sig Dispense Refill  . atorvastatin (LIPITOR) 20 MG tablet Take 20 mg by mouth daily.   4   No current facility-administered medications for this visit.     Allergies  Allergen Reactions  . Codeine     Sleepiness     Past Medical History:  Diagnosis Date  . HYPERLIPIDEMIA 03/26/2010   Qualifier: Diagnosis of  By: Cathren HarshBeese MD, Jeannett SeniorStephen    . HYPERTENSION 03/26/2010   Qualifier: Diagnosis of  By: Cathren HarshBeese MD, Jeannett SeniorStephen    . SEBACEOUS CYST, INFECTED 03/26/2010   Qualifier: Diagnosis of  By: Cathren HarshBeese MD, Jeannett SeniorStephen      Past Surgical History:  Procedure Laterality Date  . ABDOMINAL HYSTERECTOMY    . APPENDECTOMY    . arthroscopic knee surgery      Social History   Socioeconomic History  . Marital status: Single    Spouse name: Not on file  . Number of children: Not on file  . Years of education: Not on file  . Highest education level: Not on file  Social Needs  . Financial resource strain: Not on file  . Food insecurity - worry: Not on file  . Food insecurity - inability: Not on file  . Transportation needs - medical: Not on file  . Transportation needs - non-medical: Not on file  Occupational History  . Not on file  Tobacco Use  . Smoking status: Current Every Day Smoker  . Smokeless tobacco: Never Used  Substance and Sexual Activity    . Alcohol use: Yes    Comment: Occasional  . Drug use: Not on file    Comment: Cocaine in the past  . Sexual activity: Not on file  Other Topics Concern  . Not on file  Social History Narrative  . Not on file    Family History  Problem Relation Age of Onset  . Hypertension Mother     ROS: Recent cold but no fevers or chills, hemoptysis, dysphasia, odynophagia, melena, hematochezia, dysuria, hematuria, rash, seizure activity, orthopnea, PND, pedal edema, claudication. Remaining systems are negative.  Physical Exam:   Blood pressure (!) 190/94, pulse 63, height 5' 1.5" (1.562 m), weight 133 lb 1.9 oz (60.4 kg).  General:  Well developed/well nourished in NAD Skin warm/dry Patient not depressed No peripheral clubbing Back-normal HEENT-normal/normal eyelids Neck supple/normal carotid upstroke bilaterally; no bruits; no JVD; no thyromegaly chest - CTA/ normal expansion CV - RRR/normal S1 and S2; no murmurs, rubs or gallops;  PMI nondisplaced Abdomen -NT/ND, no HSM, no mass, + bowel sounds, no bruit 2+ femoral pulses, no bruits Ext-no edema, chords, 2+ DP Neuro-grossly nonfocal  ECG -sinus rhythm at a rate of 63.  Cannot rule out prior septal infarct.  Personally reviewed  A/P  1 chest pain-symptoms with both typical and atypical features.  Her electrocardiogram shows sinus rhythm and prior  septal infarct cannot be excluded.  I will arrange a stress echocardiogram to screen for coronary disease and also to assess wall motion.    2 tobacco abuse-patient counseled on discontinuing.  3 elevated blood pressure-patient was told recently by her primary care that blood pressure was elevated.  She is to contact him in the near future and she will likely need addition of medication.  I would likely begin with either a diuretic or calcium blocker.  She will follow-up with primary care for this issue.  4 hyperlipidemia-continue statin.  Olga Millers, MD

## 2018-01-21 ENCOUNTER — Encounter: Payer: Self-pay | Admitting: Cardiology

## 2018-01-21 ENCOUNTER — Ambulatory Visit: Payer: BLUE CROSS/BLUE SHIELD | Admitting: Cardiology

## 2018-01-21 VITALS — BP 190/94 | HR 63 | Ht 61.5 in | Wt 133.1 lb

## 2018-01-21 DIAGNOSIS — E7800 Pure hypercholesterolemia, unspecified: Secondary | ICD-10-CM

## 2018-01-21 DIAGNOSIS — I10 Essential (primary) hypertension: Secondary | ICD-10-CM

## 2018-01-21 DIAGNOSIS — R072 Precordial pain: Secondary | ICD-10-CM

## 2018-01-21 NOTE — Patient Instructions (Signed)
Exercise Stress Echocardiogram An exercise stress echocardiogram is a test that checks how well your heart is working. For this test, you will walk on a treadmill to make your heart beat faster. This test uses sound waves (ultrasound) and a computer to make pictures (images) of your heart. These pictures will be taken before you exercise and after you exercise. What happens before the procedure?  Follow instructions from your doctor about what you cannot eat or drink before the test.  Do not drink or eat anything that has caffeine in it. Stop having caffeine for 24 hours before the test.  Ask your doctor about changing or stopping your normal medicines. This is important if you take diabetes medicines or blood thinners. Ask your doctor if you should take your medicines with water before the test.  If you use an inhaler, bring it to the test.  Do not use any products that have nicotine or tobacco in them, such as cigarettes and e-cigarettes. Stop using them for 4 hours before the test. If you need help quitting, ask your doctor.  Wear comfortable shoes and clothing. What happens during the procedure?  You will be hooked up to a TV screen. Your doctor will watch the screen to see how fast your heart beats during the test.  Before you exercise, a computer will make a picture of your heart. To do this: ? A gel will be put on your chest. ? A wand will be moved over the gel. ? Sound waves from the wand will go to the computer to make the picture.  Your will start walking on a treadmill. The treadmill will start at a slow speed. It will get faster a little bit at a time. When you walk faster, your heart will beat faster.  The treadmill will be stopped when your heart is working hard.  You will lie down right away so another picture of your heart can be taken.  The test will take 30-60 minutes. What happens after the procedure?  Your heart rate and blood pressure will be watched after the  test.  If your doctor says that you can, you may: ? Eat what you usually eat. ? Do your normal activities. ? Take medicines like normal. Summary  An exercise stress echocardiogram is a test that checks how well your heart is working.  Follow instructions about what you cannot eat or drink before the test. Ask your doctor if you should take your normal medicines before the test.  Stop having caffeine for 24 hours before the test. Do not use anything with nicotine or tobacco in it for 4 hours before the test.  A computer will take a picture of your heart before you walk on a treadmill. It will take another picture when you are done walking.  Your heart rate and blood pressure will be watched after the test. This information is not intended to replace advice given to you by your health care provider. Make sure you discuss any questions you have with your health care provider. Document Released: 10/13/2009 Document Revised: 09/08/2016 Document Reviewed: 09/08/2016 Elsevier Interactive Patient Education  2017 [employer]   Your physician recommends that you schedule a follow-up appointment in: AS NEEDED PENDING TEST RESULTS

## 2018-01-26 DIAGNOSIS — Z720 Tobacco use: Secondary | ICD-10-CM | POA: Diagnosis not present

## 2018-01-26 DIAGNOSIS — I10 Essential (primary) hypertension: Secondary | ICD-10-CM | POA: Diagnosis not present

## 2018-01-29 ENCOUNTER — Telehealth (HOSPITAL_COMMUNITY): Payer: Self-pay | Admitting: *Deleted

## 2018-01-29 NOTE — Telephone Encounter (Signed)
Patient given detailed instructions per Stress Test Requisition Sheet for test on 02/03/18 at 2:30. Patient Notified to arrive 30 minutes early, and that it is imperative to arrive on time for appointment to keep from having the test rescheduled.  Patient verbalized understanding. Daneil DolinSharon S Brooks

## 2018-01-29 NOTE — Telephone Encounter (Signed)
Left message on voicemail in reference to upcoming appointment scheduled for 02/03/18. Phone number given for a call back so details instructions can be given. Daneil DolinSharon S Hayden

## 2018-02-03 ENCOUNTER — Ambulatory Visit (HOSPITAL_COMMUNITY): Payer: BLUE CROSS/BLUE SHIELD | Attending: Cardiovascular Disease

## 2018-02-03 ENCOUNTER — Ambulatory Visit (HOSPITAL_COMMUNITY): Payer: BLUE CROSS/BLUE SHIELD

## 2018-02-03 ENCOUNTER — Other Ambulatory Visit: Payer: Self-pay

## 2018-02-03 DIAGNOSIS — R072 Precordial pain: Secondary | ICD-10-CM | POA: Insufficient documentation

## 2018-02-26 DIAGNOSIS — I10 Essential (primary) hypertension: Secondary | ICD-10-CM | POA: Diagnosis not present

## 2018-02-26 DIAGNOSIS — Z720 Tobacco use: Secondary | ICD-10-CM | POA: Diagnosis not present

## 2018-03-17 DIAGNOSIS — I10 Essential (primary) hypertension: Secondary | ICD-10-CM | POA: Diagnosis not present

## 2018-10-06 DIAGNOSIS — Z23 Encounter for immunization: Secondary | ICD-10-CM | POA: Diagnosis not present

## 2018-12-28 DIAGNOSIS — Z1211 Encounter for screening for malignant neoplasm of colon: Secondary | ICD-10-CM | POA: Diagnosis not present

## 2018-12-28 DIAGNOSIS — Z0000 Encounter for general adult medical examination without abnormal findings: Secondary | ICD-10-CM | POA: Diagnosis not present

## 2018-12-28 DIAGNOSIS — E785 Hyperlipidemia, unspecified: Secondary | ICD-10-CM | POA: Diagnosis not present

## 2019-11-18 DIAGNOSIS — Z23 Encounter for immunization: Secondary | ICD-10-CM | POA: Diagnosis not present

## 2019-12-02 DIAGNOSIS — J069 Acute upper respiratory infection, unspecified: Secondary | ICD-10-CM | POA: Diagnosis not present

## 2019-12-02 DIAGNOSIS — R05 Cough: Secondary | ICD-10-CM | POA: Diagnosis not present

## 2019-12-03 ENCOUNTER — Other Ambulatory Visit: Payer: Self-pay

## 2019-12-03 DIAGNOSIS — Z20822 Contact with and (suspected) exposure to covid-19: Secondary | ICD-10-CM

## 2019-12-06 LAB — NOVEL CORONAVIRUS, NAA: SARS-CoV-2, NAA: NOT DETECTED

## 2019-12-10 ENCOUNTER — Other Ambulatory Visit: Payer: Self-pay | Admitting: Family Medicine

## 2019-12-10 DIAGNOSIS — Z1231 Encounter for screening mammogram for malignant neoplasm of breast: Secondary | ICD-10-CM

## 2019-12-13 DIAGNOSIS — J988 Other specified respiratory disorders: Secondary | ICD-10-CM | POA: Diagnosis not present

## 2020-01-13 DIAGNOSIS — Z0000 Encounter for general adult medical examination without abnormal findings: Secondary | ICD-10-CM | POA: Diagnosis not present

## 2020-03-23 DIAGNOSIS — R42 Dizziness and giddiness: Secondary | ICD-10-CM | POA: Diagnosis not present

## 2020-03-23 DIAGNOSIS — R7309 Other abnormal glucose: Secondary | ICD-10-CM | POA: Diagnosis not present

## 2020-03-23 DIAGNOSIS — E785 Hyperlipidemia, unspecified: Secondary | ICD-10-CM | POA: Diagnosis not present

## 2020-04-14 ENCOUNTER — Ambulatory Visit: Payer: Self-pay | Attending: Internal Medicine

## 2020-04-14 DIAGNOSIS — Z23 Encounter for immunization: Secondary | ICD-10-CM

## 2020-04-14 NOTE — Progress Notes (Signed)
   Covid-19 Vaccination Clinic  Name:  Ruth Hayden    MRN: [id number] DOB: 1961-03-30  04/14/2020  [name] was observed post Covid-19 immunization for 15 minutes without incident. She was provided with Vaccine Information Sheet and instruction to access the V-Safe system.   [name] was instructed to call 911 with any severe reactions post vaccine: Marland Kitchen Difficulty breathing  . Swelling of face and throat  . A fast heartbeat  . A bad rash all over body  . Dizziness and weakness   Immunizations Administered    Name Date Dose VIS Date Route   Pfizer COVID-19 Vaccine 04/14/2020  8:13 AM 0.3 mL 12/10/2019 Intramuscular   Manufacturer: [employer], [employer]   Lot: [id number]   NDC: [address]-3

## 2020-05-08 ENCOUNTER — Ambulatory Visit: Payer: Self-pay | Attending: Internal Medicine

## 2020-05-08 DIAGNOSIS — Z23 Encounter for immunization: Secondary | ICD-10-CM

## 2020-05-08 NOTE — Progress Notes (Signed)
   Covid-19 Vaccination Clinic  Name:  Ruth Hayden    MRN: [id number] DOB: [birth year]  05/08/2020  Ruth Hayden was observed post Covid-19 immunization for 15 minutes without incident. She was provided with Vaccine Information Sheet and instruction to access the V-Safe system.   Ruth Hayden was instructed to call 911 with any severe reactions post vaccine: Marland Kitchen Difficulty breathing  . Swelling of face and throat  . A fast heartbeat  . A bad rash all over body  . Dizziness and weakness   Immunizations Administered    Name Date Dose VIS Date Route   Pfizer COVID-19 Vaccine 05/08/2020  8:24 AM 0.3 mL 02/23/2019 Intramuscular   Manufacturer: [employer], [employer]   Lot: [id number]   NDC: [address]-0

## 2020-07-12 DIAGNOSIS — R112 Nausea with vomiting, unspecified: Secondary | ICD-10-CM | POA: Diagnosis not present

## 2020-12-04 DIAGNOSIS — R059 Cough, unspecified: Secondary | ICD-10-CM | POA: Diagnosis not present

## 2020-12-04 DIAGNOSIS — J069 Acute upper respiratory infection, unspecified: Secondary | ICD-10-CM | POA: Diagnosis not present

## 2020-12-05 DIAGNOSIS — R059 Cough, unspecified: Secondary | ICD-10-CM | POA: Diagnosis not present

## 2020-12-05 DIAGNOSIS — Z03818 Encounter for observation for suspected exposure to other biological agents ruled out: Secondary | ICD-10-CM | POA: Diagnosis not present

## 2020-12-17 DIAGNOSIS — R059 Cough, unspecified: Secondary | ICD-10-CM | POA: Diagnosis not present

## 2020-12-17 DIAGNOSIS — K611 Rectal abscess: Secondary | ICD-10-CM | POA: Diagnosis not present

## 2020-12-17 DIAGNOSIS — Z03818 Encounter for observation for suspected exposure to other biological agents ruled out: Secondary | ICD-10-CM | POA: Diagnosis not present

## 2020-12-20 DIAGNOSIS — K611 Rectal abscess: Secondary | ICD-10-CM | POA: Diagnosis not present

## 2021-01-03 DIAGNOSIS — Z23 Encounter for immunization: Secondary | ICD-10-CM | POA: Diagnosis not present

## 2021-01-03 DIAGNOSIS — Z0000 Encounter for general adult medical examination without abnormal findings: Secondary | ICD-10-CM | POA: Diagnosis not present

## 2021-01-03 DIAGNOSIS — R7303 Prediabetes: Secondary | ICD-10-CM | POA: Diagnosis not present

## 2021-01-03 DIAGNOSIS — E785 Hyperlipidemia, unspecified: Secondary | ICD-10-CM | POA: Diagnosis not present

## 2021-01-03 DIAGNOSIS — E7800 Pure hypercholesterolemia, unspecified: Secondary | ICD-10-CM | POA: Diagnosis not present

## 2021-01-03 DIAGNOSIS — I10 Essential (primary) hypertension: Secondary | ICD-10-CM | POA: Diagnosis not present

## 2021-01-05 DIAGNOSIS — B5801 Toxoplasma chorioretinitis: Secondary | ICD-10-CM | POA: Diagnosis not present

## 2021-01-05 DIAGNOSIS — I10 Essential (primary) hypertension: Secondary | ICD-10-CM | POA: Diagnosis not present

## 2021-01-05 DIAGNOSIS — H35033 Hypertensive retinopathy, bilateral: Secondary | ICD-10-CM | POA: Diagnosis not present

## 2021-01-05 DIAGNOSIS — H2513 Age-related nuclear cataract, bilateral: Secondary | ICD-10-CM | POA: Diagnosis not present

## 2021-01-26 ENCOUNTER — Ambulatory Visit
Admission: RE | Admit: 2021-01-26 | Discharge: 2021-01-26 | Disposition: A | Discharge status: Home / Self Care | Payer: BC Managed Care – PPO | Source: Ambulatory Visit | Attending: Family Medicine | Admitting: Family Medicine

## 2021-01-26 ENCOUNTER — Other Ambulatory Visit: Payer: Self-pay

## 2021-01-26 DIAGNOSIS — Z1231 Encounter for screening mammogram for malignant neoplasm of breast: Secondary | ICD-10-CM

## 2021-02-01 ENCOUNTER — Other Ambulatory Visit: Payer: Self-pay | Admitting: Family Medicine

## 2021-02-01 DIAGNOSIS — R928 Other abnormal and inconclusive findings on diagnostic imaging of breast: Secondary | ICD-10-CM

## 2021-02-16 ENCOUNTER — Ambulatory Visit
Admission: RE | Admit: 2021-02-16 | Discharge: 2021-02-16 | Disposition: A | Discharge status: Home / Self Care | Payer: BC Managed Care – PPO | Source: Ambulatory Visit | Attending: Family Medicine | Admitting: Family Medicine

## 2021-02-16 ENCOUNTER — Other Ambulatory Visit: Payer: Self-pay

## 2021-02-16 ENCOUNTER — Other Ambulatory Visit: Payer: Self-pay | Admitting: Family Medicine

## 2021-02-16 DIAGNOSIS — R928 Other abnormal and inconclusive findings on diagnostic imaging of breast: Secondary | ICD-10-CM

## 2021-02-16 DIAGNOSIS — R922 Inconclusive mammogram: Secondary | ICD-10-CM | POA: Diagnosis not present

## 2021-02-16 DIAGNOSIS — N6489 Other specified disorders of breast: Secondary | ICD-10-CM | POA: Diagnosis not present

## 2021-02-26 ENCOUNTER — Other Ambulatory Visit: Payer: Self-pay | Admitting: *Deleted

## 2021-02-26 DIAGNOSIS — F17210 Nicotine dependence, cigarettes, uncomplicated: Secondary | ICD-10-CM

## 2021-03-15 DIAGNOSIS — Z0000 Encounter for general adult medical examination without abnormal findings: Secondary | ICD-10-CM | POA: Diagnosis not present

## 2021-03-15 DIAGNOSIS — R945 Abnormal results of liver function studies: Secondary | ICD-10-CM | POA: Diagnosis not present

## 2021-03-15 DIAGNOSIS — E785 Hyperlipidemia, unspecified: Secondary | ICD-10-CM | POA: Diagnosis not present

## 2021-03-15 DIAGNOSIS — I10 Essential (primary) hypertension: Secondary | ICD-10-CM | POA: Diagnosis not present

## 2021-04-04 ENCOUNTER — Encounter: Payer: Self-pay | Admitting: Acute Care

## 2021-04-04 ENCOUNTER — Other Ambulatory Visit: Payer: Self-pay

## 2021-04-04 ENCOUNTER — Ambulatory Visit
Admission: RE | Admit: 2021-04-04 | Discharge: 2021-04-04 | Disposition: A | Discharge status: Home / Self Care | Payer: BC Managed Care – PPO | Source: Ambulatory Visit | Attending: Acute Care | Admitting: Acute Care

## 2021-04-04 ENCOUNTER — Ambulatory Visit (INDEPENDENT_AMBULATORY_CARE_PROVIDER_SITE_OTHER): Payer: BC Managed Care – PPO | Admitting: Acute Care

## 2021-04-04 VITALS — BP 138/74 | HR 85 | Temp 98.2°F | Ht 61.0 in | Wt 121.4 lb

## 2021-04-04 DIAGNOSIS — F17210 Nicotine dependence, cigarettes, uncomplicated: Secondary | ICD-10-CM

## 2021-04-04 DIAGNOSIS — Z122 Encounter for screening for malignant neoplasm of respiratory organs: Secondary | ICD-10-CM

## 2021-04-04 NOTE — Patient Instructions (Signed)
Thank you for participating in the [facility]. It was our pleasure to meet you today. We will call you with the results of your scan within the next few days. Your scan will be assigned a Lung RADS category score by the physicians reading the scans.  This Lung RADS score determines follow up scanning.  See below for description of categories, and follow up screening recommendations. We will be in touch to schedule your follow up screening annually or based on recommendations of our providers. We will fax a copy of your scan results to your Primary Care Physician, or the physician who referred you to the program, to ensure they have the results. Please call the office if you have any questions or concerns regarding your scanning experience or results.  Our office number is [phone]. Please speak with Denise Phelps, RN. She is our Lung Cancer Screening RN. If she is unavailable when you call, please have the office staff send her a message. She will return your call at her earliest convenience. Remember, if your scan is normal, we will scan you annually as long as you continue to meet the criteria for the program. (Age 55-77, Current smoker or smoker who has quit within the last 15 years). If you are a smoker, remember, quitting is the single most powerful action that you can take to decrease your risk of lung cancer and other pulmonary, breathing related problems. We know quitting is hard, and we are here to help.  Please let us know if there is anything we can do to help you meet your goal of quitting. If you are a former smoker, congratulations. We are proud of you! Remain smoke free! Remember you can refer friends or family members through the number above.  We will screen them to make sure they meet criteria for the program. Thank you for helping us take better care of you by participating in Lung Screening.  Lung RADS Categories:  Lung RADS 1: no nodules  or definitely non-concerning nodules.  Recommendation is for a repeat annual scan in 12 months.  Lung RADS 2:  nodules that are non-concerning in appearance and behavior with a very low likelihood of becoming an active cancer. Recommendation is for a repeat annual scan in 12 months.  Lung RADS 3: nodules that are probably non-concerning , includes nodules with a low likelihood of becoming an active cancer.  Recommendation is for a 6-month repeat screening scan. Often noted after an upper respiratory illness. We will be in touch to make sure you have no questions, and to schedule your 6-month scan.  Lung RADS 4 A: nodules with concerning findings, recommendation is most often for a follow up scan in 3 months or additional testing based on our provider's assessment of the scan. We will be in touch to make sure you have no questions and to schedule the recommended 3 month follow up scan.  Lung RADS 4 B:  indicates findings that are concerning. We will be in touch with you to schedule additional diagnostic testing based on our provider's  assessment of the scan.   

## 2021-04-04 NOTE — Progress Notes (Signed)
Shared Decision Making Visit Lung Cancer Screening Program [phone])   Eligibility:  Age 60 y.o.  Pack Years Smoking History Calculation 45 pack year smoking history (# packs/per year x # years smoked)  Recent History of coughing up blood  no  Unexplained weight loss? no ( >Than 15 pounds within the last 6 months )  Prior History Lung / other cancer no (Diagnosis within the last 5 years already requiring surveillance chest CT Scans).  Smoking Status Current Smoker Former Smokers: Years since quit: NA  Quit Date: NA  Visit Components:  Discussion included one or more decision making aids. yes  Discussion included risk/benefits of screening. yes  Discussion included potential follow up diagnostic testing for abnormal scans. yes  Discussion included meaning and risk of over diagnosis. yes  Discussion included meaning and risk of False Positives. yes  Discussion included meaning of total radiation exposure. yes  Counseling Included:  Importance of adherence to annual lung cancer LDCT screening. yes  Impact of comorbidities on ability to participate in the program. yes  Ability and willingness to under diagnostic treatment. yes  Smoking Cessation Counseling:  Current Smokers:   Discussed importance of smoking cessation. yes  Information about tobacco cessation classes and interventions provided to patient. yes  Patient provided with "ticket" for LDCT Scan. yes  Symptomatic Patient. no  Counseling NA  Diagnosis Code: Tobacco Use Z72.0  Asymptomatic Patient yes  Counseling (Intermediate counseling: > three minutes counseling) [id number]  Former Smokers:   Discussed the importance of maintaining cigarette abstinence. yes  Diagnosis Code: Personal History of Nicotine Dependence. D66.440  Information about tobacco cessation classes and interventions provided to patient. Yes  Patient provided with "ticket" for LDCT Scan. yes  Written Order for Lung Cancer  Screening with LDCT placed in Epic. Yes (CT Chest Lung Cancer Screening Low Dose W/O CM) [id number] Z12.2-Screening of respiratory organs Z87.891-Personal history of nicotine dependence  BP 138/74 (BP Location: Right Arm, Cuff Size: Normal)   Pulse 85   Temp 98.2 F (36.8 C) (Oral)   Ht 5\' 1"  (1.549 m)   Wt 121 lb 6.4 oz (55.1 kg)   SpO2 100%   BMI 22.94 kg/m    I have spent 25 minutes of face to face time with Ruth Hayden discussing the risks and benefits of lung cancer screening. We viewed a power point together that explained in detail the above noted topics. We paused at intervals to allow for questions to be asked and answered to ensure understanding.We discussed that the single most powerful action that she can take to decrease her risk of developing lung cancer is to quit smoking. We discussed whether or not she is ready to commit to setting a quit date. We discussed options for tools to aid in quitting smoking including nicotine replacement therapy, non-nicotine medications, support groups, Quit Smart classes, and behavior modification. We discussed that often times setting smaller, more achievable goals, such as eliminating 1 cigarette a day for a week and then 2 cigarettes a day for a week can be helpful in slowly decreasing the number of cigarettes smoked. This allows for a sense of accomplishment as well as providing a clinical benefit. I gave her the " Be Stronger Than Your Excuses" card with contact information for community resources, classes, free nicotine replacement therapy, and access to mobile apps, text messaging, and on-line smoking cessation help. I have also given her my card and contact information in the event she needs to contact me. We discussed the  time and location of the scan, and that either Abigail Miyamoto RN or I will call with the results within 24-48 hours of receiving them. I have offered her  a copy of the power point we viewed  as a resource in the event they need  reinforcement of the concepts we discussed today in the office. The patient verbalized understanding of all of  the above and had no further questions upon leaving the office. They have my contact information in the event they have any further questions.  I spent 3 minutes counseling on smoking cessation and the health risks of continued tobacco abuse.  I explained to the patient that there has been a high incidence of coronary artery disease noted on these exams. I explained that this is a non-gated exam therefore degree or severity cannot be determined. This patient is currently on statin therapy. I have asked the patient to follow-up with their PCP regarding any incidental finding of coronary artery disease and management with diet or medication as their PCP  feels is clinically indicated. The patient verbalized understanding of the above and had no further questions upon completion of the visit.      Bevelyn Ngo, NP 04/04/2021 .

## 2021-04-23 NOTE — Progress Notes (Signed)

## 2021-04-24 ENCOUNTER — Other Ambulatory Visit: Payer: Self-pay | Admitting: *Deleted

## 2021-04-24 DIAGNOSIS — F17210 Nicotine dependence, cigarettes, uncomplicated: Secondary | ICD-10-CM

## 2021-04-24 DIAGNOSIS — E876 Hypokalemia: Secondary | ICD-10-CM | POA: Diagnosis not present

## 2021-05-04 DIAGNOSIS — M25562 Pain in left knee: Secondary | ICD-10-CM | POA: Diagnosis not present

## 2021-05-22 DIAGNOSIS — M25562 Pain in left knee: Secondary | ICD-10-CM | POA: Diagnosis not present

## 2021-06-08 DIAGNOSIS — M25562 Pain in left knee: Secondary | ICD-10-CM | POA: Diagnosis not present

## 2021-06-20 ENCOUNTER — Other Ambulatory Visit: Payer: Self-pay | Admitting: Orthopedic Surgery

## 2021-06-20 DIAGNOSIS — M25562 Pain in left knee: Secondary | ICD-10-CM

## 2021-06-24 ENCOUNTER — Other Ambulatory Visit: Payer: Self-pay

## 2021-06-24 ENCOUNTER — Ambulatory Visit
Admission: RE | Admit: 2021-06-24 | Discharge: 2021-06-24 | Disposition: A | Discharge status: Home / Self Care | Payer: BC Managed Care – PPO | Source: Ambulatory Visit | Attending: Orthopedic Surgery | Admitting: Orthopedic Surgery

## 2021-06-24 DIAGNOSIS — M25562 Pain in left knee: Secondary | ICD-10-CM | POA: Diagnosis not present

## 2021-07-06 DIAGNOSIS — M25562 Pain in left knee: Secondary | ICD-10-CM | POA: Diagnosis not present

## 2021-07-18 DIAGNOSIS — M25562 Pain in left knee: Secondary | ICD-10-CM | POA: Diagnosis not present

## 2021-07-26 DIAGNOSIS — M25562 Pain in left knee: Secondary | ICD-10-CM | POA: Diagnosis not present

## 2021-08-07 DIAGNOSIS — M25562 Pain in left knee: Secondary | ICD-10-CM | POA: Diagnosis not present

## 2021-08-14 DIAGNOSIS — M25562 Pain in left knee: Secondary | ICD-10-CM | POA: Diagnosis not present

## 2021-08-17 DIAGNOSIS — M25562 Pain in left knee: Secondary | ICD-10-CM | POA: Diagnosis not present

## 2021-08-21 DIAGNOSIS — M25562 Pain in left knee: Secondary | ICD-10-CM | POA: Diagnosis not present

## 2021-08-24 DIAGNOSIS — M25562 Pain in left knee: Secondary | ICD-10-CM | POA: Diagnosis not present

## 2021-08-30 DIAGNOSIS — M25562 Pain in left knee: Secondary | ICD-10-CM | POA: Diagnosis not present

## 2021-09-04 DIAGNOSIS — M25562 Pain in left knee: Secondary | ICD-10-CM | POA: Diagnosis not present

## 2021-09-06 DIAGNOSIS — M25562 Pain in left knee: Secondary | ICD-10-CM | POA: Diagnosis not present

## 2021-09-13 DIAGNOSIS — M25562 Pain in left knee: Secondary | ICD-10-CM | POA: Diagnosis not present

## 2021-09-20 DIAGNOSIS — M25562 Pain in left knee: Secondary | ICD-10-CM | POA: Diagnosis not present

## 2021-09-25 DIAGNOSIS — M25562 Pain in left knee: Secondary | ICD-10-CM | POA: Diagnosis not present

## 2021-09-27 DIAGNOSIS — M25562 Pain in left knee: Secondary | ICD-10-CM | POA: Diagnosis not present

## 2021-10-02 DIAGNOSIS — M25562 Pain in left knee: Secondary | ICD-10-CM | POA: Diagnosis not present

## 2021-10-11 DIAGNOSIS — M25562 Pain in left knee: Secondary | ICD-10-CM | POA: Diagnosis not present

## 2021-10-30 DIAGNOSIS — J069 Acute upper respiratory infection, unspecified: Secondary | ICD-10-CM | POA: Diagnosis not present

## 2021-11-08 DIAGNOSIS — Z20822 Contact with and (suspected) exposure to covid-19: Secondary | ICD-10-CM | POA: Diagnosis not present

## 2021-11-08 DIAGNOSIS — R0602 Shortness of breath: Secondary | ICD-10-CM | POA: Diagnosis not present

## 2021-11-09 ENCOUNTER — Other Ambulatory Visit: Payer: Self-pay | Admitting: Family Medicine

## 2021-11-09 ENCOUNTER — Ambulatory Visit
Admission: RE | Admit: 2021-11-09 | Discharge: 2021-11-09 | Disposition: A | Discharge status: Home / Self Care | Payer: BC Managed Care – PPO | Source: Ambulatory Visit | Attending: Family Medicine | Admitting: Family Medicine

## 2021-11-09 DIAGNOSIS — R0602 Shortness of breath: Secondary | ICD-10-CM | POA: Diagnosis not present

## 2021-11-19 DIAGNOSIS — R1310 Dysphagia, unspecified: Secondary | ICD-10-CM | POA: Diagnosis not present

## 2021-11-19 DIAGNOSIS — J988 Other specified respiratory disorders: Secondary | ICD-10-CM | POA: Diagnosis not present

## 2021-11-19 DIAGNOSIS — R2689 Other abnormalities of gait and mobility: Secondary | ICD-10-CM | POA: Diagnosis not present

## 2021-12-12 DIAGNOSIS — Z03818 Encounter for observation for suspected exposure to other biological agents ruled out: Secondary | ICD-10-CM | POA: Diagnosis not present

## 2021-12-12 DIAGNOSIS — R059 Cough, unspecified: Secondary | ICD-10-CM | POA: Diagnosis not present

## 2021-12-12 DIAGNOSIS — R1110 Vomiting, unspecified: Secondary | ICD-10-CM | POA: Diagnosis not present

## 2021-12-12 DIAGNOSIS — R5381 Other malaise: Secondary | ICD-10-CM | POA: Diagnosis not present

## 2021-12-21 DIAGNOSIS — R1310 Dysphagia, unspecified: Secondary | ICD-10-CM | POA: Diagnosis not present

## 2021-12-21 DIAGNOSIS — J029 Acute pharyngitis, unspecified: Secondary | ICD-10-CM | POA: Diagnosis not present

## 2021-12-21 DIAGNOSIS — R634 Abnormal weight loss: Secondary | ICD-10-CM | POA: Diagnosis not present

## 2021-12-21 DIAGNOSIS — Z720 Tobacco use: Secondary | ICD-10-CM | POA: Diagnosis not present

## 2022-01-22 DIAGNOSIS — Z23 Encounter for immunization: Secondary | ICD-10-CM | POA: Diagnosis not present

## 2022-01-22 DIAGNOSIS — Z1211 Encounter for screening for malignant neoplasm of colon: Secondary | ICD-10-CM | POA: Diagnosis not present

## 2022-01-22 DIAGNOSIS — E785 Hyperlipidemia, unspecified: Secondary | ICD-10-CM | POA: Diagnosis not present

## 2022-01-22 DIAGNOSIS — I700 Atherosclerosis of aorta: Secondary | ICD-10-CM | POA: Diagnosis not present

## 2022-01-22 DIAGNOSIS — D649 Anemia, unspecified: Secondary | ICD-10-CM | POA: Diagnosis not present

## 2022-01-22 DIAGNOSIS — Z0000 Encounter for general adult medical examination without abnormal findings: Secondary | ICD-10-CM | POA: Diagnosis not present

## 2022-01-22 DIAGNOSIS — R7309 Other abnormal glucose: Secondary | ICD-10-CM | POA: Diagnosis not present

## 2022-01-22 DIAGNOSIS — Z720 Tobacco use: Secondary | ICD-10-CM | POA: Diagnosis not present

## 2022-01-22 DIAGNOSIS — J439 Emphysema, unspecified: Secondary | ICD-10-CM | POA: Diagnosis not present

## 2022-02-07 DIAGNOSIS — J069 Acute upper respiratory infection, unspecified: Secondary | ICD-10-CM | POA: Diagnosis not present

## 2022-02-07 DIAGNOSIS — E785 Hyperlipidemia, unspecified: Secondary | ICD-10-CM | POA: Diagnosis not present

## 2022-05-22 ENCOUNTER — Other Ambulatory Visit: Payer: Self-pay | Admitting: *Deleted

## 2022-05-22 DIAGNOSIS — Z122 Encounter for screening for malignant neoplasm of respiratory organs: Secondary | ICD-10-CM

## 2022-05-22 DIAGNOSIS — Z87891 Personal history of nicotine dependence: Secondary | ICD-10-CM

## 2022-05-22 DIAGNOSIS — F17210 Nicotine dependence, cigarettes, uncomplicated: Secondary | ICD-10-CM

## 2022-06-04 ENCOUNTER — Ambulatory Visit
Admission: RE | Admit: 2022-06-04 | Discharge: 2022-06-04 | Disposition: A | Discharge status: Home / Self Care | Payer: Self-pay | Source: Ambulatory Visit | Attending: Acute Care | Admitting: Acute Care

## 2022-06-04 DIAGNOSIS — Z87891 Personal history of nicotine dependence: Secondary | ICD-10-CM

## 2022-06-04 DIAGNOSIS — F17210 Nicotine dependence, cigarettes, uncomplicated: Secondary | ICD-10-CM

## 2022-06-04 DIAGNOSIS — Z122 Encounter for screening for malignant neoplasm of respiratory organs: Secondary | ICD-10-CM

## 2022-06-07 ENCOUNTER — Other Ambulatory Visit: Payer: Self-pay

## 2022-06-07 DIAGNOSIS — Z87891 Personal history of nicotine dependence: Secondary | ICD-10-CM

## 2022-06-07 DIAGNOSIS — Z122 Encounter for screening for malignant neoplasm of respiratory organs: Secondary | ICD-10-CM

## 2022-06-07 DIAGNOSIS — F17210 Nicotine dependence, cigarettes, uncomplicated: Secondary | ICD-10-CM

## 2023-04-20 LAB — COLOGUARD: COLOGUARD: POSITIVE — AB

## 2023-05-17 ENCOUNTER — Other Ambulatory Visit: Payer: Self-pay | Admitting: Acute Care

## 2023-05-17 ENCOUNTER — Encounter: Payer: Self-pay | Admitting: Acute Care

## 2023-05-17 DIAGNOSIS — Z87891 Personal history of nicotine dependence: Secondary | ICD-10-CM

## 2023-05-17 DIAGNOSIS — F17210 Nicotine dependence, cigarettes, uncomplicated: Secondary | ICD-10-CM

## 2023-05-17 DIAGNOSIS — Z122 Encounter for screening for malignant neoplasm of respiratory organs: Secondary | ICD-10-CM

## 2023-06-10 ENCOUNTER — Encounter: Payer: Self-pay | Admitting: Acute Care

## 2023-06-10 ENCOUNTER — Ambulatory Visit: Admission: RE | Admit: 2023-06-10 | Payer: Commercial Managed Care - HMO | Source: Ambulatory Visit

## 2023-06-26 ENCOUNTER — Ambulatory Visit
Admission: RE | Admit: 2023-06-26 | Discharge: 2023-06-26 | Disposition: A | Discharge status: Home / Self Care | Payer: Commercial Managed Care - HMO | Source: Ambulatory Visit | Attending: Acute Care | Admitting: Acute Care

## 2023-06-26 DIAGNOSIS — Z87891 Personal history of nicotine dependence: Secondary | ICD-10-CM

## 2023-06-26 DIAGNOSIS — F17210 Nicotine dependence, cigarettes, uncomplicated: Secondary | ICD-10-CM

## 2023-06-26 DIAGNOSIS — Z122 Encounter for screening for malignant neoplasm of respiratory organs: Secondary | ICD-10-CM

## 2023-07-01 ENCOUNTER — Other Ambulatory Visit: Payer: Self-pay | Admitting: Acute Care

## 2023-07-01 ENCOUNTER — Telehealth: Payer: Self-pay | Admitting: Acute Care

## 2023-07-01 DIAGNOSIS — Z87891 Personal history of nicotine dependence: Secondary | ICD-10-CM

## 2023-07-01 DIAGNOSIS — Z122 Encounter for screening for malignant neoplasm of respiratory organs: Secondary | ICD-10-CM

## 2023-07-01 NOTE — Telephone Encounter (Signed)
Tiffany call with call report for patient.  Please call back at [phone]

## 2023-07-01 NOTE — Telephone Encounter (Signed)
Call report  °

## 2023-07-01 NOTE — Telephone Encounter (Signed)
Called and spoke to Ruth Hayden with the reading room informing us of the newly noted T6 compression fracture. Called and spoke to patient and informed her of this new finding. Pt verbalized understanding and states she is following up with her "spine doctor" this month but will call tomorrow to make her aware of the results to see if she needs to be seen sooner. Nothing further needed at this time.    IMPRESSION: 1. Lung-RADS 2, benign appearance or behavior. Continue annual screening with low-dose chest CT without contrast in 12 months. 2. Mild superior T6 vertebral compression fracture of uncertain chronicity, new from 06/04/2022 screening chest CT, potentially acute. 3. Three-vessel coronary atherosclerosis. 4. Aortic Atherosclerosis (ICD10-I70.0) and Emphysema (ICD10-J43.9).

## 2024-08-27 ENCOUNTER — Telehealth: Payer: Self-pay

## 2024-08-27 DIAGNOSIS — F17210 Nicotine dependence, cigarettes, uncomplicated: Secondary | ICD-10-CM

## 2024-08-27 DIAGNOSIS — Z87891 Personal history of nicotine dependence: Secondary | ICD-10-CM

## 2024-08-27 DIAGNOSIS — Z122 Encounter for screening for malignant neoplasm of respiratory organs: Secondary | ICD-10-CM

## 2024-08-27 NOTE — Telephone Encounter (Signed)
 Spoke with patient and scheduled lung screening CT 09/01/24 9:30.

## 2024-08-27 NOTE — Telephone Encounter (Signed)
 Copied from CRM [id number]. Topic: Clinical - Request for Lab/Test Order >> Aug 27, 2024 11:23 AM Russell PARAS wrote: Reason for CRM:   Pt is requesting order for annual LCS to be put in chart, and for a call back to schedule.    CB#  [phone]   Please advise. Thanks!

## 2024-09-01 ENCOUNTER — Ambulatory Visit

## 2024-09-01 DIAGNOSIS — Z87891 Personal history of nicotine dependence: Secondary | ICD-10-CM

## 2024-09-01 DIAGNOSIS — F17210 Nicotine dependence, cigarettes, uncomplicated: Secondary | ICD-10-CM

## 2024-09-01 DIAGNOSIS — Z122 Encounter for screening for malignant neoplasm of respiratory organs: Secondary | ICD-10-CM | POA: Diagnosis not present

## 2024-09-13 ENCOUNTER — Other Ambulatory Visit: Payer: Self-pay

## 2024-09-13 DIAGNOSIS — Z87891 Personal history of nicotine dependence: Secondary | ICD-10-CM

## 2024-09-13 DIAGNOSIS — F17210 Nicotine dependence, cigarettes, uncomplicated: Secondary | ICD-10-CM

## 2024-09-13 DIAGNOSIS — Z122 Encounter for screening for malignant neoplasm of respiratory organs: Secondary | ICD-10-CM
